# Patient Record
Sex: Male | Born: 1980 | Race: Black or African American | Hispanic: No | Marital: Single | Smoking: Current every day smoker
Health system: Southern US, Community
[De-identification: ages and names within clinical notes are randomized; demographics above are authoritative.]

## PROBLEM LIST (undated history)

## (undated) HISTORY — PX: FEMUR FRACTURE SURGERY: SHX633

---

## 2011-08-25 ENCOUNTER — Emergency Department (HOSPITAL_BASED_OUTPATIENT_CLINIC_OR_DEPARTMENT_OTHER)
Admission: EM | Admit: 2011-08-25 | Discharge: 2011-08-25 | Disposition: A | Discharge status: Home / Self Care | Payer: Self-pay | Attending: Emergency Medicine | Admitting: Emergency Medicine

## 2011-08-25 ENCOUNTER — Encounter (HOSPITAL_BASED_OUTPATIENT_CLINIC_OR_DEPARTMENT_OTHER): Payer: Self-pay | Admitting: Emergency Medicine

## 2011-08-25 ENCOUNTER — Emergency Department (HOSPITAL_BASED_OUTPATIENT_CLINIC_OR_DEPARTMENT_OTHER): Payer: Self-pay

## 2011-08-25 DIAGNOSIS — S93602A Unspecified sprain of left foot, initial encounter: Secondary | ICD-10-CM

## 2011-08-25 DIAGNOSIS — X58XXXA Exposure to other specified factors, initial encounter: Secondary | ICD-10-CM | POA: Insufficient documentation

## 2011-08-25 DIAGNOSIS — F17200 Nicotine dependence, unspecified, uncomplicated: Secondary | ICD-10-CM | POA: Insufficient documentation

## 2011-08-25 DIAGNOSIS — S93609A Unspecified sprain of unspecified foot, initial encounter: Secondary | ICD-10-CM | POA: Insufficient documentation

## 2011-08-25 MED ORDER — ACETAMINOPHEN-CODEINE #3 300-30 MG PO TABS
2.0000 | ORAL_TABLET | Freq: Once | ORAL | Status: AC
  Administered 2011-08-25: 2 via ORAL
  Filled 2011-08-25: qty 2

## 2011-08-25 MED ORDER — ACETAMINOPHEN-CODEINE #3 300-30 MG PO TABS
1.0000 | ORAL_TABLET | Freq: Four times a day (QID) | ORAL | Status: AC | PRN
Start: 2011-08-25 — End: 2011-09-04

## 2011-08-25 NOTE — ED Notes (Signed)
Pt c/o left foot pain after stepping in a hole.

## 2011-08-25 NOTE — ED Provider Notes (Signed)
History     CSN: [id number]  Arrival date & time 08/25/11  1927   First MD Initiated Contact with Patient 08/25/11 2056      Chief Complaint  Patient presents with  . Foot Injury    (Consider location/radiation/quality/duration/timing/severity/associated sxs/prior treatment) HPI Comments: 31 year old male presents approximately 24 hours after stepping in a polyp on having a hyperdorsiflexion of the left foot. The pain has been constant, moderate, worse with ambulation and associated with swelling.  Patient is a 31 y.o. male presenting with foot injury. The history is provided by the patient and the spouse.  Foot Injury  Pertinent negatives include no numbness.    History reviewed. No pertinent past medical history.  Past Surgical History  Procedure Date  . Femur fracture surgery     No family history on file.  History  Substance Use Topics  . Smoking status: Current Everyday Smoker  . Smokeless tobacco: Not on file  . Alcohol Use: Yes      Review of Systems  Musculoskeletal: Positive for joint swelling.  Neurological: Negative for numbness.    Allergies  Aspirin  Home Medications   Current Outpatient Rx  Name Route Sig Dispense Refill  . ACETAMINOPHEN-CODEINE #3 300-30 MG PO TABS Oral Take 1-2 tablets by mouth every 6 (six) hours as needed for pain. 15 tablet 0    BP 150/76  Pulse 74  Temp 98.1 F (36.7 C) (Oral)  Resp 18  Ht 6\' 3"  (1.905 m)  Wt 225 lb (102.059 kg)  BMI 28.12 kg/m2  SpO2 98%  Physical Exam  Nursing note and vitals reviewed. Constitutional: He appears well-developed and well-nourished. No distress.  HENT:  Head: Normocephalic and atraumatic.  Eyes: Conjunctivae are normal. No scleral icterus.  Cardiovascular: Normal rate, regular rhythm and intact distal pulses.   Pulmonary/Chest: Effort normal and breath sounds normal.  Musculoskeletal: He exhibits tenderness ( Tender to palpation over the dorsum of the mid left foot. There is  mild swelling in this area, there is normal pulse underlying swelling. There is mild pain with range of motion of the left second through fourth toes). He exhibits no edema.  Neurological: He is alert.       Sensation normal  Skin: Skin is warm and dry. No rash noted. He is not diaphoretic.    ED Course  Procedures (including critical care time)  Labs Reviewed - No data to display Dg Foot Complete Left  08/25/2011  *RADIOLOGY REPORT*  Clinical Data: Foot injury  LEFT FOOT - COMPLETE 3+ VIEW  Comparison: None.  Findings: Three views of the left foot submitted.  No displaced fracture or subluxation. There is a vague lucency at the base of the second and third metatarsal.  A subtle nondisplaced fracture cannot be excluded.  Clinical correlation is necessary.  IMPRESSION: No displaced fracture or subluxation. There is a vague lucency at the base of the second and third metatarsal.  A subtle nondisplaced fracture cannot be excluded.  Clinical correlation is necessary.  Original Report Authenticated By: Natasha Mead, M.D.     1. Sprain of left foot       MDM  X-rays reviewed showing no obvious displaced fracture or subluxation. I have described the x-ray in detail to the patient and encouraged him to followup in 7-10 days for repeat imaging if still having symptoms. Postop boot, Tylenol 3 given        Vida Roller, MD 08/25/11 2141

## 2011-08-25 NOTE — ED Notes (Signed)
Pt was not found on stretcher, pt was outside in car smoking. Pt assisted back into dept in w/c and placed on stretcher. NAD noted.

## 2011-08-25 NOTE — ED Notes (Signed)
Patient transported to X-ray 

## 2011-08-26 NOTE — ED Notes (Signed)
Pt called requesting work note. Dr. Anitra Lauth reviewed chart and approved 1 wk off. Work note left at [employer], pt called and will present ID when picking it up. Pt will need to f/u with ortho if not improving after 1 wk.

## 2011-09-02 ENCOUNTER — Emergency Department (HOSPITAL_BASED_OUTPATIENT_CLINIC_OR_DEPARTMENT_OTHER)
Admission: EM | Admit: 2011-09-02 | Discharge: 2011-09-02 | Disposition: A | Discharge status: Home / Self Care | Payer: Self-pay | Attending: Emergency Medicine | Admitting: Emergency Medicine

## 2011-09-02 ENCOUNTER — Encounter (HOSPITAL_BASED_OUTPATIENT_CLINIC_OR_DEPARTMENT_OTHER): Payer: Self-pay | Admitting: *Deleted

## 2011-09-02 DIAGNOSIS — X58XXXA Exposure to other specified factors, initial encounter: Secondary | ICD-10-CM | POA: Insufficient documentation

## 2011-09-02 DIAGNOSIS — F17200 Nicotine dependence, unspecified, uncomplicated: Secondary | ICD-10-CM | POA: Insufficient documentation

## 2011-09-02 DIAGNOSIS — S99929A Unspecified injury of unspecified foot, initial encounter: Secondary | ICD-10-CM

## 2011-09-02 DIAGNOSIS — Z888 Allergy status to other drugs, medicaments and biological substances status: Secondary | ICD-10-CM | POA: Insufficient documentation

## 2011-09-02 DIAGNOSIS — S8990XA Unspecified injury of unspecified lower leg, initial encounter: Secondary | ICD-10-CM | POA: Insufficient documentation

## 2011-09-02 NOTE — ED Notes (Addendum)
Left foot pain. Was seen here for injury a week ago and diagnosed with a sprain. Tried to follow up with referral we gave him and can not get in to see them x 1 week. States he is here to because he needs a work note.

## 2011-09-02 NOTE — ED Provider Notes (Signed)
History     CSN: [id number]  Arrival date & time 09/02/11  1939   First MD Initiated Contact with Patient 09/02/11 1951      Chief Complaint  Patient presents with  . Foot Injury     HPI Patient was here last week evaluated for foot injury.  Has an appointment to see orthopedist in the next day or 2.  Requesting a longer work excuse till next Monday.  States the pain and swelling have been improving.  He doesn't been new pain medicine. History reviewed. No pertinent past medical history.  Past Surgical History  Procedure Date  . Femur fracture surgery     No family history on file.  History  Substance Use Topics  . Smoking status: Current Everyday Smoker  . Smokeless tobacco: Not on file  . Alcohol Use: Yes      Review of Systems  All other systems reviewed and are negative.    Allergies  Aspirin  Home Medications   Current Outpatient Rx  Name Route Sig Dispense Refill  . ACETAMINOPHEN-CODEINE #3 300-30 MG PO TABS Oral Take 1-2 tablets by mouth every 6 (six) hours as needed for pain. 15 tablet 0    BP 142/76  Pulse 90  Temp 98.1 F (36.7 C) (Oral)  Resp 20  SpO2 98%  Physical Exam  Nursing note and vitals reviewed. Constitutional: He appears well-developed and well-nourished. No distress.  HENT:  Head: Normocephalic and atraumatic.  Eyes: Conjunctivae are normal. No scleral icterus.  Cardiovascular: Normal rate, regular rhythm and intact distal pulses.   Pulmonary/Chest: Effort normal and breath sounds normal.  Musculoskeletal: He exhibits tenderness ( Tender to palpation over the dorsum of the mid left foot. There is mild swelling in this area, there is normal pulse underlying swelling. There is mild pain with range of motion of the left second through fourth toes). He exhibits no edema.  Neurological: He is alert.       Sensation normal  Skin: Skin is warm and dry. No rash noted. He is not diaphoretic.    ED Course  Procedures (including  critical care time)  Labs Reviewed - No data to display No results found.   1. Foot injury       MDM         Nelia Shi, MD 09/02/11 2001

## 2012-05-03 ENCOUNTER — Encounter (HOSPITAL_BASED_OUTPATIENT_CLINIC_OR_DEPARTMENT_OTHER): Payer: Self-pay | Admitting: Family Medicine

## 2012-05-03 ENCOUNTER — Emergency Department (HOSPITAL_BASED_OUTPATIENT_CLINIC_OR_DEPARTMENT_OTHER)
Admission: EM | Admit: 2012-05-03 | Discharge: 2012-05-03 | Disposition: A | Discharge status: Home / Self Care | Payer: Self-pay | Attending: Emergency Medicine | Admitting: Emergency Medicine

## 2012-05-03 DIAGNOSIS — F17200 Nicotine dependence, unspecified, uncomplicated: Secondary | ICD-10-CM | POA: Insufficient documentation

## 2012-05-03 DIAGNOSIS — K0889 Other specified disorders of teeth and supporting structures: Secondary | ICD-10-CM

## 2012-05-03 DIAGNOSIS — K089 Disorder of teeth and supporting structures, unspecified: Secondary | ICD-10-CM | POA: Insufficient documentation

## 2012-05-03 MED ORDER — AMOXICILLIN 500 MG PO CAPS: 500.0000 mg | ORAL_CAPSULE | Freq: Three times a day (TID) | ORAL | Status: DC

## 2012-05-03 MED ORDER — HYDROCODONE-ACETAMINOPHEN 5-325 MG PO TABS: 2.0000 | ORAL_TABLET | ORAL | Status: DC | PRN

## 2012-05-03 NOTE — ED Provider Notes (Signed)
Medical screening examination/treatment/procedure(s) were performed by non-physician practitioner and as supervising physician I was immediately available for consultation/collaboration.  Raeford Razor, MD 05/03/12 [phone]

## 2012-05-03 NOTE — ED Provider Notes (Signed)
History     CSN: [id number]  Arrival date & time 05/03/12  1140   First MD Initiated Contact with Patient 05/03/12 1212      Chief Complaint  Patient presents with  . Dental Problem    (Consider location/radiation/quality/duration/timing/severity/associated sxs/prior treatment) Patient is a 32 y.o. male presenting with dental injury. The history is provided by the patient. No language interpreter was used.  Dental Injury This is a new problem. The current episode started yesterday. The problem occurs constantly. The problem has been gradually worsening. Nothing aggravates the symptoms. He has tried nothing for the symptoms. The treatment provided moderate relief.  Pt complains   History reviewed. No pertinent past medical history.  Past Surgical History  Procedure Laterality Date  . Femur fracture surgery      No family history on file.  History  Substance Use Topics  . Smoking status: Current Every Day Smoker  . Smokeless tobacco: Not on file  . Alcohol Use: Yes      Review of Systems  HENT: Positive for dental problem.   All other systems reviewed and are negative.    Allergies  Aspirin  Home Medications  No current outpatient prescriptions on file.  BP 139/75  Pulse 85  Temp(Src) 98.3 F (36.8 C) (Oral)  Resp 16  Ht 6\' 3"  (1.905 m)  Wt 225 lb (102.059 kg)  BMI 28.12 kg/m2  SpO2 100%  Physical Exam  Nursing note and vitals reviewed. Constitutional: He appears well-developed and well-nourished.  HENT:  Head: Normocephalic and atraumatic.  Broken tooth swollen gum left lower 2nd molar    Eyes: Conjunctivae are normal. Pupils are equal, round, and reactive to light.  Cardiovascular: Normal rate.   Pulmonary/Chest: Effort normal.  Musculoskeletal: Normal range of motion.  Neurological: He is alert.  Skin: Skin is warm.  Psychiatric: He has a normal mood and affect.    ED Course  Procedures (including critical care time)  Labs Reviewed - No  data to display No results found.   1. Toothache       MDM  amoxicillian and hydrocodone.   Pt advised to follow up with Dr. Mayford Knife for dental evaluation        Elson Areas, PA-C 05/03/12 1238  Lonia Skinner [place], [place] 05/03/12 1242

## 2012-05-03 NOTE — ED Notes (Signed)
Pt c/o left lower dental pain and swelling with h/o broken tooth.

## 2012-05-03 NOTE — ED Notes (Signed)
Report received and care assumed.

## 2012-11-26 ENCOUNTER — Emergency Department (HOSPITAL_BASED_OUTPATIENT_CLINIC_OR_DEPARTMENT_OTHER)
Admission: EM | Admit: 2012-11-26 | Discharge: 2012-11-27 | Disposition: A | Discharge status: Home / Self Care | Payer: Self-pay | Attending: Emergency Medicine | Admitting: Emergency Medicine

## 2012-11-26 ENCOUNTER — Encounter (HOSPITAL_BASED_OUTPATIENT_CLINIC_OR_DEPARTMENT_OTHER): Payer: Self-pay | Admitting: Emergency Medicine

## 2012-11-26 DIAGNOSIS — K5289 Other specified noninfective gastroenteritis and colitis: Secondary | ICD-10-CM | POA: Insufficient documentation

## 2012-11-26 DIAGNOSIS — IMO0001 Reserved for inherently not codable concepts without codable children: Secondary | ICD-10-CM | POA: Insufficient documentation

## 2012-11-26 DIAGNOSIS — Z792 Long term (current) use of antibiotics: Secondary | ICD-10-CM | POA: Insufficient documentation

## 2012-11-26 DIAGNOSIS — F17200 Nicotine dependence, unspecified, uncomplicated: Secondary | ICD-10-CM | POA: Insufficient documentation

## 2012-11-26 DIAGNOSIS — K529 Noninfective gastroenteritis and colitis, unspecified: Secondary | ICD-10-CM

## 2012-11-26 MED ORDER — ONDANSETRON 4 MG PO TBDP
4.0000 mg | ORAL_TABLET | Freq: Once | ORAL | Status: AC
  Administered 2012-11-26: 4 mg via ORAL
  Filled 2012-11-26: qty 1

## 2012-11-26 MED ORDER — ONDANSETRON 4 MG PO TBDP: ORAL_TABLET | ORAL | Status: DC

## 2012-11-26 NOTE — ED Notes (Addendum)
above note and triage not done by Joss Benton but by Alfonzo Feller

## 2012-11-26 NOTE — ED Provider Notes (Signed)
CSN: [id number]     Arrival date & time 11/26/12  2237 History  This chart was scribed for Loren Racer, MD by Dorothey Baseman, ED Scribe. This patient was seen in room MH05/MH05 and the patient's care was started at 11:14 PM.    Chief Complaint  Patient presents with  . Abdominal Pain   Patient is a 32 y.o. male presenting with abdominal pain. The history is provided by the patient. No language interpreter was used.  Abdominal Pain Pain location:  Periumbilical Pain quality: sharp   Pain radiates to:  Does not radiate Pain severity:  Moderate Onset quality:  Sudden Timing:  Intermittent Chronicity:  New Context: sick contacts and suspicious food intake   Relieved by:  Bowel activity and vomiting Associated symptoms: diarrhea, nausea and vomiting   Associated symptoms: no hematochezia    HPI Comments: Dalton Gomez is a 32 y.o. male who presents to the Emergency Department complaining of a intermittent, sharp pain to the periumbilical abdominal region. Patient reports associated nausea onset last night, 4-5 episodes of emesis and diarrhea this morning, and myalgias. He states that the abdominal pain was temporarily relieved following each episode of emesis/diarrhea. He reports that his daughter has been ill with similar symptoms and that they both ate the same food before onset of both of their symptoms. 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 He denies hematochezia. Patient reports an allergy to aspirin. He denies any other pertinent medical history.   History reviewed. No pertinent past medical history. Past Surgical History  Procedure Laterality Date  . Femur fracture surgery     History reviewed. No pertinent family history. History  Substance Use Topics  . Smoking status: Current Every Day Smoker  . Smokeless tobacco: Not on file  . Alcohol Use: Yes    Review of Systems  Gastrointestinal: Positive for nausea, vomiting, abdominal  pain and diarrhea. Negative for hematochezia.  Musculoskeletal: Positive for myalgias.    Allergies  Aspirin  Home Medications   Current Outpatient Rx  Name  Route  Sig  Dispense  Refill  . amoxicillin (AMOXIL) 500 MG capsule   Oral   Take 1 capsule (500 mg total) by mouth 3 (three) times daily.   21 capsule   0   . HYDROcodone-acetaminophen (NORCO/VICODIN) 5-325 MG per tablet   Oral   Take 2 tablets by mouth every 4 (four) hours as needed for pain.   10 tablet   0    Triage Vitals: BP 131/66  Pulse 84  Temp(Src) 98.9 F (37.2 C)  Resp 16  Wt 228 lb (103.42 kg)  SpO2 99%  Physical Exam  Nursing note and vitals reviewed. Constitutional: He is oriented to person, place, and time. He appears well-developed and well-nourished. No distress.  HENT:  Head: Normocephalic and atraumatic.  Mild posterior pharyngeal erythema.   Eyes: Conjunctivae are normal.  Neck: Normal range of motion. Neck supple.  Cardiovascular: Normal rate, regular rhythm and normal heart sounds.  Exam reveals no gallop and no friction rub.   No murmur heard. Pulmonary/Chest: Effort normal. No respiratory distress. He has no wheezes. He has no rales.  Abdominal: Soft. He exhibits no distension. There is no tenderness.  High-pitched bowel sounds.  Musculoskeletal: Normal range of motion.  Neurological: He is alert and oriented to person, place, and time.  Skin: Skin is warm and dry.  Psychiatric: He has a normal mood and affect. His behavior is  normal.    ED Course  Procedures (including critical care time)  DIAGNOSTIC STUDIES: Oxygen Saturation is 99% on room air, normal by my interpretation.    COORDINATION OF CARE: 11:19 PM- Discussed that symptoms are likely viral in nature or related to food poisoning. Patient is refusing further workup including IV fluids and labs. He asked to have a work note. Advised patient to stay well-hydrated. Will discharge patient with anti-nausea medication to  manage symptoms.  Discussed treatment plan with patient at bedside and patient verbalized agreement.     Labs Review Labs Reviewed - No data to display Imaging Review No results found.  EKG Interpretation   None       MDM  Patient well-appearing with a benign abdominal exam. Do not appear to be dehydrated at this point. Refusing any further workup. Patient is asking for work note. Return precautions given.  I personally performed the services described in this documentation, which was scribed in my presence. The recorded information has been reviewed and is accurate.      Loren Racer, MD 11/27/12 [phone]

## 2012-11-26 NOTE — ED Notes (Signed)
Onset last evening of abd pain with n/v/d

## 2014-07-31 ENCOUNTER — Emergency Department (HOSPITAL_BASED_OUTPATIENT_CLINIC_OR_DEPARTMENT_OTHER)
Admission: EM | Admit: 2014-07-31 | Discharge: 2014-07-31 | Disposition: A | Discharge status: Home / Self Care | Payer: Worker's Compensation | Attending: Emergency Medicine | Admitting: Emergency Medicine

## 2014-07-31 ENCOUNTER — Encounter (HOSPITAL_BASED_OUTPATIENT_CLINIC_OR_DEPARTMENT_OTHER): Payer: Self-pay

## 2014-07-31 ENCOUNTER — Emergency Department (HOSPITAL_BASED_OUTPATIENT_CLINIC_OR_DEPARTMENT_OTHER): Payer: Self-pay

## 2014-07-31 DIAGNOSIS — M25461 Effusion, right knee: Secondary | ICD-10-CM

## 2014-07-31 DIAGNOSIS — Z720 Tobacco use: Secondary | ICD-10-CM | POA: Insufficient documentation

## 2014-07-31 DIAGNOSIS — M25561 Pain in right knee: Secondary | ICD-10-CM

## 2014-07-31 MED ORDER — TRAMADOL HCL 50 MG PO TABS: 50.0000 mg | ORAL_TABLET | Freq: Four times a day (QID) | ORAL | Status: DC | PRN

## 2014-07-31 NOTE — Discharge Instructions (Signed)
Take tramadol as prescribed as needed for pain.  Knee Effusion The medical term for having fluid in your knee is effusion. This is often due to an internal derangement of the knee. This means something is wrong inside the knee. Some of the causes of fluid in the knee may be torn cartilage, a torn ligament, or bleeding into the joint from an injury. Your knee is likely more difficult to bend and move. This is often because there is increased pain and pressure in the joint. The time it takes for recovery from a knee effusion depends on different factors, including:   Type of injury.  Your age.  Physical and medical conditions.  Rehabilitation Strategies. How long you will be away from your normal activities will depend on what kind of knee problem you have and how much damage is present. Your knee has two types of cartilage. Articular cartilage covers the bone ends and lets your knee bend and move smoothly. Two menisci, thick pads of cartilage that form a rim inside the joint, help absorb shock and stabilize your knee. Ligaments bind the bones together and support your knee joint. Muscles move the joint, help support your knee, and take stress off the joint itself. CAUSES  Often an effusion in the knee is caused by an injury to one of the menisci. This is often a tear in the cartilage. Recovery after a meniscus injury depends on how much meniscus is damaged and whether you have damaged other knee tissue. Small tears may heal on their own with conservative treatment. Conservative means rest, limited weight bearing activity and muscle strengthening exercises. Your recovery may take up to 6 weeks.  TREATMENT  Larger tears may require surgery. Meniscus injuries may be treated during arthroscopy. Arthroscopy is a procedure in which your surgeon uses a small telescope like instrument to look in your knee. Your caregiver can make a more accurate diagnosis (learning what is wrong) by performing an arthroscopic  procedure. If your injury is on the inner margin of the meniscus, your surgeon may trim the meniscus back to a smooth rim. In other cases your surgeon will try to repair a damaged meniscus with stitches (sutures). This may make rehabilitation take longer, but may provide better long term result by helping your knee keep its shock absorption capabilities. Ligaments which are completely torn usually require surgery for repair. HOME CARE INSTRUCTIONS  Use crutches as instructed.  If a brace is applied, use as directed.  Once you are home, an ice pack applied to your swollen knee may help with discomfort and help decrease swelling.  Keep your knee raised (elevated) when you are not up and around or on crutches.  Only take over-the-counter or prescription medicines for pain, discomfort, or fever as directed by your caregiver.  Your caregivers will help with instructions for rehabilitation of your knee. This often includes strengthening exercises.  You may resume a normal diet and activities as directed. SEEK MEDICAL CARE IF:   There is increased swelling in your knee.  You notice redness, swelling, or increasing pain in your knee.  An unexplained oral temperature above 102 F (38.9 C) develops. SEEK IMMEDIATE MEDICAL CARE IF:   You develop a rash.  You have difficulty breathing.  You have any allergic reactions from medications you may have been given.  There is severe pain with any motion of the knee. MAKE SURE YOU:   Understand these instructions.  Will watch your condition.  Will get help right away  if you are not doing well or get worse. Document Released: 03/27/2003 Document Revised: 03/29/2011 Document Reviewed: 05/31/2007 [facility] Patient Information 2015 [place], [place]. This information is not intended to replace advice given to you by your health care provider. Make sure you discuss any questions you have with your health care provider. RICE: Routine Care for  Injuries The routine care of many injuries includes Rest, Ice, Compression, and Elevation (RICE). HOME CARE INSTRUCTIONS  Rest is needed to allow your body to heal. Routine activities can usually be resumed when comfortable. Injured tendons and bones can take up to 6 weeks to heal. Tendons are the cord-like structures that attach muscle to bone.  Ice following an injury helps keep the swelling down and reduces pain.  Put ice in a plastic bag.  Place a towel between your skin and the bag.  Leave the ice on for 15-20 minutes, 3-4 times a day, or as directed by your health care provider. Do this while awake, for the first 24 to 48 hours. After that, continue as directed by your caregiver.  Compression helps keep swelling down. It also gives support and helps with discomfort. If an elastic bandage has been applied, it should be removed and reapplied every 3 to 4 hours. It should not be applied tightly, but firmly enough to keep swelling down. Watch fingers or toes for swelling, bluish discoloration, coldness, numbness, or excessive pain. If any of these problems occur, remove the bandage and reapply loosely. Contact your caregiver if these problems continue.  Elevation helps reduce swelling and decreases pain. With extremities, such as the arms, hands, legs, and feet, the injured area should be placed near or above the level of the heart, if possible. SEEK IMMEDIATE MEDICAL CARE IF:  You have persistent pain and swelling.  You develop redness, numbness, or unexpected weakness.  Your symptoms are getting worse rather than improving after several days. These symptoms may indicate that further evaluation or further X-rays are needed. Sometimes, X-rays may not show a small broken bone (fracture) until 1 week or 10 days later. Make a follow-up appointment with your caregiver. Ask when your X-ray results will be ready. Make sure you get your X-ray results. Document Released: 04/18/2000 Document  Revised: 01/09/2013 Document Reviewed: 06/05/2010 [facility] Patient Information 2015 [place], [place]. This information is not intended to replace advice given to you by your health care provider. Make sure you discuss any questions you have with your health care provider.

## 2014-07-31 NOTE — ED Provider Notes (Signed)
CSN: [id number]     Arrival date & time 07/31/14  1219 History   First MD Initiated Contact with Patient 07/31/14 1231     Chief Complaint  Patient presents with  . Knee Pain     (Consider location/radiation/quality/duration/timing/severity/associated sxs/prior Treatment) HPI Comments: 34 year old male complaining of right knee pain and swelling 4 days. He cannot recall any specific injury or trauma, however states he works at [employer] and is constantly bending, squatting and lifting with his job. After work 4 days ago, his right knee became sore, gradually worsening, and 3 days ago when he woke up, he noticed his knee was swollen. Yesterday, he started to have intense pain with ambulating, improved by rest. He has been icing and elevating with some relief of the pain and swelling. Pain at rest 2/10. No prior injury to his knee, however when he was younger, broke his right femur. Denies numbness or tingling. No back pain.  Patient is a 34 y.o. male presenting with knee pain. The history is provided by the patient.  Knee Pain   History reviewed. No pertinent past medical history. Past Surgical History  Procedure Laterality Date  . Femur fracture surgery     No family history on file. History  Substance Use Topics  . Smoking status: Current Every Day Smoker  . Smokeless tobacco: Not on file  . Alcohol Use: Yes     Comment: occ    Review of Systems  Respiratory: Negative for shortness of breath.   Cardiovascular: Negative for chest pain.  Musculoskeletal:       + R knee pain and swelling.  Skin: Negative for color change.  Neurological: Negative for numbness.      Allergies  Aspirin  Home Medications   Prior to Admission medications   Medication Sig Start Date End Date Taking? Authorizing Provider  traMADol (ULTRAM) 50 MG tablet Take 1 tablet (50 mg total) by mouth every 6 (six) hours as needed. 07/31/14   Maday Guarino M Jashan Cotten, PA-C   BP 138/77 mmHg  Pulse 103  Temp(Src)  98.3 F (36.8 C) (Oral)  Resp 18  Ht 6\' 3"  (1.905 m)  Wt 232 lb (105.235 kg)  BMI 29.00 kg/m2 Physical Exam  Constitutional: He is oriented to person, place, and time. He appears well-developed and well-nourished. No distress.  HENT:  Head: Normocephalic and atraumatic.  Eyes: Conjunctivae and EOM are normal.  Neck: Normal range of motion. Neck supple.  Cardiovascular: Normal rate, regular rhythm and normal heart sounds.   Pulses:      Dorsalis pedis pulses are 2+ on the right side.       Posterior tibial pulses are 2+ on the right side.  Pulmonary/Chest: Effort normal and breath sounds normal.  Musculoskeletal: Normal range of motion.  R knee TTP medially with small effusion. No erythema or warmth. No ligamentous laxity appreciated. FROM.  Neurological: He is alert and oriented to person, place, and time.  Skin: Skin is warm and dry.  Psychiatric: He has a normal mood and affect. His behavior is normal.  Nursing note and vitals reviewed.   ED Course  Procedures (including critical care time) Labs Review Labs Reviewed - No data to display  Imaging Review Dg Knee Complete 4 Views Right  07/31/2014   CLINICAL DATA:  Twisted right knee 07/27/2014, swelling  EXAM: RIGHT KNEE - COMPLETE 4+ VIEW  COMPARISON:  None.  FINDINGS: Four views of the right knee submitted. No acute fracture or subluxation. Mild narrowing of  medial joint compartment. Small joint effusion.  IMPRESSION: No acute fracture or subluxation. Mild narrowing of medial joint compartment. Small joint effusion.   Electronically Signed   By: Natasha Mead M.D.   On: 07/31/2014 12:58     EKG Interpretation None      MDM   Final diagnoses:  Knee effusion, right  Right knee pain   NAD. Neurovascularly intact. Xray results as stated above. No ligamentous laxity appreciated. Possible click of medial meniscus. Cannot rule out meniscal injury. Knee immobilizer applied. Has crutches at home. RICE. Reports allergy to aspirin,  unsure if he has reaction to NSAIDs. Tramadol for  Pain. F/u with Dr. Pearletha Forge, sports med. Stable for d/c. Return precautions given. Patient states understanding of treatment care plan and is agreeable.  Kathrynn Speed, PA-C 07/31/14 1312  Tilden Fossa, MD 07/31/14 1345

## 2014-07-31 NOTE — ED Notes (Signed)
C/o pain/swelling to right knee-started after work day-worse after returning to work this week-denies specefic injury-pt with steady gait-has own crutch x 1

## 2015-02-12 ENCOUNTER — Encounter (HOSPITAL_BASED_OUTPATIENT_CLINIC_OR_DEPARTMENT_OTHER): Payer: Self-pay

## 2015-02-12 ENCOUNTER — Emergency Department (HOSPITAL_BASED_OUTPATIENT_CLINIC_OR_DEPARTMENT_OTHER)
Admission: EM | Admit: 2015-02-12 | Discharge: 2015-02-12 | Disposition: A | Discharge status: Home / Self Care | Payer: Self-pay | Attending: Emergency Medicine | Admitting: Emergency Medicine

## 2015-02-12 DIAGNOSIS — K047 Periapical abscess without sinus: Secondary | ICD-10-CM | POA: Insufficient documentation

## 2015-02-12 DIAGNOSIS — F17200 Nicotine dependence, unspecified, uncomplicated: Secondary | ICD-10-CM | POA: Insufficient documentation

## 2015-02-12 MED ORDER — OXYCODONE-ACETAMINOPHEN 5-325 MG PO TABS: ORAL_TABLET | ORAL | Status: DC

## 2015-02-12 MED ORDER — OXYCODONE-ACETAMINOPHEN 5-325 MG PO TABS
1.0000 | ORAL_TABLET | Freq: Once | ORAL | Status: AC
  Administered 2015-02-12: 1 via ORAL
  Filled 2015-02-12: qty 1

## 2015-02-12 MED ORDER — AMOXICILLIN 500 MG PO CAPS
1000.0000 mg | ORAL_CAPSULE | Freq: Once | ORAL | Status: AC
  Administered 2015-02-12: 1000 mg via ORAL
  Filled 2015-02-12: qty 2

## 2015-02-12 MED ORDER — AMOXICILLIN 500 MG PO CAPS: 1000.0000 mg | ORAL_CAPSULE | Freq: Two times a day (BID) | ORAL | Status: DC

## 2015-02-12 NOTE — ED Provider Notes (Signed)
CSN: [id number]     Arrival date & time 02/12/15  1702 History   First MD Initiated Contact with Patient 02/12/15 1741     Chief Complaint  Patient presents with  . Dental Injury     (Consider location/radiation/quality/duration/timing/severity/associated sxs/prior Treatment) HPI   Blood pressure 148/95, pulse 77, temperature 99.1 F (37.3 C), temperature source Oral, resp. rate 18, height  (1.905 m), weight 106.595 kg, SpO2 97 %.  Dalton Gomez is a 35 y.o. male complaining of right frontal toothache onset 3 days ago with associated swelling onset this morning. Patient has been taking Tylenol, ibuprofen and using Anbesol at home with little relief. Denies fever/chills, difficulty opening jaw, difficulty swallowing, SOB, neck swelling.    History reviewed. No pertinent past medical history. Past Surgical History  Procedure Laterality Date  . Femur fracture surgery     No family history on file. Social History  Substance Use Topics  . Smoking status: Current Every Day Smoker  . Smokeless tobacco: None  . Alcohol Use: Yes     Comment: occ    Review of Systems  10 systems reviewed and found to be negative, except as noted in the HPI.  Allergies  Aspirin  Home Medications   Prior to Admission medications   Medication Sig Start Date End Date Taking? Authorizing Provider  amoxicillin (AMOXIL) 500 MG capsule Take 2 capsules (1,000 mg total) by mouth 2 (two) times daily. 02/12/15   Laila Myhre, PA-C  oxyCODONE-acetaminophen (PERCOCET/ROXICET) 5-325 MG tablet 1 to 2 tabs PO q6hrs  PRN for pain 02/12/15   Simara Rhyner, PA-C   BP 148/95 mmHg  Pulse 77  Temp(Src) 99.1 F (37.3 C) (Oral)  Resp 18  Ht  (1.905 m)  Wt 106.595 kg  BMI 29.37 kg/m2  SpO2 97% Physical Exam  Constitutional: He is oriented to person, place, and time. He appears well-developed and well-nourished. No distress.  HENT:  Head: Normocephalic.  Mouth/Throat: Oropharynx is clear and  moist.  Generally poor dentition, mild frontal gingival swelling but no fluctuance. ++tenderness to palpation. Patient is handling their secretions. There is no tenderness to palpation or firmness underneath tongue bilaterally. No trismus.    Eyes: Conjunctivae and EOM are normal. Pupils are equal, round, and reactive to light.  Neck: Normal range of motion.  Cardiovascular: Normal rate.   Pulmonary/Chest: Effort normal. No stridor.  Abdominal: Soft.  Musculoskeletal: Normal range of motion.  Lymphadenopathy:    He has no cervical adenopathy.  Neurological: He is alert and oriented to person, place, and time.  Psychiatric: He has a normal mood and affect.  Nursing note and vitals reviewed.   ED Course  Procedures (including critical care time) Labs Review Labs Reviewed - No data to display  Imaging Review No results found. I have personally reviewed and evaluated these images and lab results as part of my medical decision-making.   EKG Interpretation None      MDM   Final diagnoses:  Dental abscess    Filed Vitals:   02/12/15 1713  BP: 148/95  Pulse: 77  Temp: 99.1 F (37.3 C)  TempSrc: Oral  Resp: 18  Height:  (1.905 m)  Weight: 106.595 kg  SpO2: 97%    Medications  amoxicillin (AMOXIL) capsule 1,000 mg (not administered)  oxyCODONE-acetaminophen (PERCOCET/ROXICET) 5-325 MG per tablet 1 tablet (not administered)    Dalton Gomez is 35 y.o. male presenting with dental pain likely early dental abscess. Patient with multiple very deep eroding  dental caries. Will be started on amoxicillin and Percocet, patient given dental resource guide.  Evaluation does not show pathology that would require ongoing emergent intervention or inpatient treatment. Pt is hemodynamically stable and mentating appropriately. Discussed findings and plan with patient/guardian, who agrees with care plan. All questions answered. Return precautions discussed and outpatient follow up  given.   New Prescriptions   AMOXICILLIN (AMOXIL) 500 MG CAPSULE    Take 2 capsules (1,000 mg total) by mouth 2 (two) times daily.   OXYCODONE-ACETAMINOPHEN (PERCOCET/ROXICET) 5-325 MG TABLET    1 to 2 tabs PO q6hrs  PRN for pain         Wynetta Emery, PA-C 02/12/15 1802  Melene Plan, DO 02/12/15 1803

## 2015-02-12 NOTE — Discharge Instructions (Signed)
Take percocet for breakthrough pain, do not drink alcohol, drive, care for children or do other critical tasks while taking percocet.  Return to the emergency room for fever, change in vision, redness to the face that rapidly spreads towards the eye, nausea or vomiting, difficulty swallowing or shortness of breath.   Apply warm compresses to jaw throughout the day.   Take your antibiotics as directed and to the end of the course. DO NOT drink alcohol when taking metronidazole, it will make you very sick!   Followup with a dentist is very important for ongoing evaluation and management of recurrent dental pain. Return to emergency department for emergent changing or worsening symptoms.  Low-cost dental clinic: [facility]  at [phone]**   You may also call [phone]  Dental Assistance If the dentist on-call cannot see you, please use the resources below:   Patients with Medicaid: [facility] [phone] [address], [phone] [address] [phone]  If unable to pay, or uninsured, contact [facility] [phone]) or [facility] Department [phone] in [place], [phone] in [facility]) to become qualified for the adult dental clinic  Other Low-Cost Community Dental Services: [facility]- [address] Natasha Bence [place], [place], [address]    [phone], Ext. [phone]    2nd and 4th Thursday of the month at 6:30am    10 clients each day by appointment, can sometimes see walk-in patients if someone does not show for an appointment [facility]- [address] Ether Griffins [place], [place], [address]    [phone] [facility] [address] [place], [place], [address]    [phone]  [facility] Health Department- [phone] [facility] Health Department- [phone] [facility] [phone]  Dental Abscess A dental abscess is pus in or around a tooth. HOME CARE  Take medicines only as told by your  dentist.  If you were prescribed antibiotic medicine, finish all of it even if you start to feel better.  Rinse your mouth (gargle) often with salt water.  Do not drive or use heavy machinery, like a lawn mower, while taking pain medicine.  Do not apply heat to the outside of your mouth.  Keep all follow-up visits as told by your dentist. This is important. GET HELP IF:  Your pain is worse, and medicine does not help. GET HELP RIGHT AWAY IF:  You have a fever or chills.  Your symptoms suddenly get worse.  You have a very bad headache.  You have problems breathing or swallowing.  You have trouble opening your mouth.  You have puffiness (swelling) in your neck or around your eye.   This information is not intended to replace advice given to you by your health care provider. Make sure you discuss any questions you have with your health care provider.   Document Released: 05/21/2014 Document Reviewed: 05/21/2014 Elsevier Interactive Patient Education [employer]

## 2015-02-12 NOTE — ED Notes (Signed)
Right toothache x 3 days-swelling to area today

## 2016-07-20 ENCOUNTER — Emergency Department (HOSPITAL_BASED_OUTPATIENT_CLINIC_OR_DEPARTMENT_OTHER)
Admission: EM | Admit: 2016-07-20 | Discharge: 2016-07-20 | Disposition: A | Discharge status: Home / Self Care | Payer: Self-pay | Attending: Emergency Medicine | Admitting: Emergency Medicine

## 2016-07-20 ENCOUNTER — Encounter (HOSPITAL_BASED_OUTPATIENT_CLINIC_OR_DEPARTMENT_OTHER): Payer: Self-pay | Admitting: Emergency Medicine

## 2016-07-20 DIAGNOSIS — Z79899 Other long term (current) drug therapy: Secondary | ICD-10-CM | POA: Insufficient documentation

## 2016-07-20 DIAGNOSIS — K047 Periapical abscess without sinus: Secondary | ICD-10-CM | POA: Insufficient documentation

## 2016-07-20 DIAGNOSIS — F17200 Nicotine dependence, unspecified, uncomplicated: Secondary | ICD-10-CM | POA: Insufficient documentation

## 2016-07-20 MED ORDER — KETOROLAC TROMETHAMINE 60 MG/2ML IM SOLN
60.0000 mg | Freq: Once | INTRAMUSCULAR | Status: AC
  Administered 2016-07-20: 60 mg via INTRAMUSCULAR
  Filled 2016-07-20: qty 2

## 2016-07-20 MED ORDER — ACETAMINOPHEN 500 MG PO TABS: 1000.0000 mg | ORAL_TABLET | Freq: Four times a day (QID) | ORAL | 0 refills | Status: AC | PRN

## 2016-07-20 MED ORDER — AMOXICILLIN-POT CLAVULANATE 875-125 MG PO TABS: 1.0000 | ORAL_TABLET | Freq: Two times a day (BID) | ORAL | 0 refills | Status: AC

## 2016-07-20 MED ORDER — NAPROXEN 250 MG PO TABS: 250.0000 mg | ORAL_TABLET | Freq: Four times a day (QID) | ORAL | 0 refills | Status: AC | PRN

## 2016-07-20 NOTE — ED Notes (Signed)
ED Provider at bedside. 

## 2016-07-20 NOTE — ED Notes (Signed)
Pt reports "he needs a root canal but the dentist will not perform this until the infection clears up". Pt also reports having hx of dental abscesses.

## 2016-07-20 NOTE — ED Provider Notes (Signed)
[facility] Provider Note   CSN: [id number] Arrival date & time: 07/20/16  1803  By signing my name below, I, Rosario Adie, attest that this documentation has been prepared under the direction and in the presence of Alvira Monday, MD. Electronically Signed: Rosario Adie, ED Scribe. 07/20/16. 11:02 PM.  History   Chief Complaint Chief Complaint  Patient presents with  . Dental Pain   The history is provided by the patient. No language interpreter was used.    HPI Comments: Dalton Gomez is a 36 y.o. male who presents to the Emergency Department complaining of persistent, gradually worsening, right-sided, upper dental pain beginning one week ago. Pt describes their pain as throbbing. He notes associated right sided facial swelling which began yesterday and has been alleviated somewhat by applying ice over the area today. He also reports some associated nausea and diarrhea secondary to this as well. Pt has also been taking Tylenol at home with minimal relief of his pain. Pt's pain is exacerbated with chewing and palpation over the right side of his face. He also reports that he does have a h/o prior dental abscess to the area which was resolved following treatment. He reports that a dental specialist informed him that he would need a root canal to the tooth; however, he was unable to have this secondary to financial constraints. Pt denies fever, chills, vomiting, or any other associated symptoms.   History reviewed. No pertinent past medical history.  There are no active problems to display for this patient.  Past Surgical History:  Procedure Laterality Date  . FEMUR FRACTURE SURGERY      Home Medications    Prior to Admission medications   Medication Sig Start Date End Date Taking? Authorizing Provider  acetaminophen (TYLENOL) 500 MG tablet Take 2 tablets (1,000 mg total) by mouth every 6 (six) hours as needed. 07/20/16   Alvira Monday, MD    amoxicillin-clavulanate (AUGMENTIN) 875-125 MG tablet Take 1 tablet by mouth every 12 (twelve) hours. 07/20/16 07/30/16  Alvira Monday, MD  naproxen (NAPROSYN) 250 MG tablet Take 1 tablet (250 mg total) by mouth every 6 (six) hours as needed. You may take 500mg  for your first dose, followed by 250mg  (1 tablet for subsequent doses) 07/20/16   Alvira Monday, MD   Family History History reviewed. No pertinent family history.  Social History Social History  Substance Use Topics  . Smoking status: Current Every Day Smoker  . Smokeless tobacco: Never Used  . Alcohol use Yes     Comment: occ   Allergies   Aspirin  Review of Systems Review of Systems  Constitutional: Negative for chills and fever.  HENT: Positive for dental problem and facial swelling.   Eyes: Negative for visual disturbance.  Respiratory: Negative for cough.   Cardiovascular: Negative for chest pain.  Gastrointestinal: Positive for nausea. Negative for diarrhea and vomiting.  Skin: Negative for rash.  Neurological: Negative for headaches.   Physical Exam Updated Vital Signs BP 132/86   Pulse 72   Temp 98.5 F (36.9 C) (Oral)   Resp 18   Ht 6\' 3"  (1.905 m)   Wt 106.6 kg (235 lb)   SpO2 98%   BMI 29.37 kg/m   Physical Exam  Constitutional: He appears well-developed and well-nourished. No distress.  HENT:  Head: Normocephalic and atraumatic.  Right Ear: Tympanic membrane, external ear and ear canal normal.  Left Ear: Tympanic membrane, external ear and ear canal normal.  Tooth #2 with severe  caries/hole. Tooth #6 missing. Tooth #7-8 with poor dentition. No drainable abscess. No sublingual swelling. No trismus  Eyes: Conjunctivae and EOM are normal. Pupils are equal, round, and reactive to light.  Neck: Normal range of motion.  Cardiovascular: Normal rate, regular rhythm and normal heart sounds.   No murmur heard. Pulmonary/Chest: Effort normal and breath sounds normal. No respiratory distress. He has no  wheezes. He has no rales.  Abdominal: He exhibits no distension.  Musculoskeletal: Normal range of motion.  Neurological: He is alert.  Skin: No pallor.  Psychiatric: He has a normal mood and affect. His behavior is normal.  Nursing note and vitals reviewed.  ED Treatments / Results  DIAGNOSTIC STUDIES: Oxygen Saturation is 100% on RA, normal by my interpretation.   COORDINATION OF CARE: 11:02 PM-Discussed next steps with pt. Pt verbalized understanding and is agreeable with the plan.   Labs (all labs ordered are listed, but only abnormal results are displayed) Labs Reviewed - No data to display  EKG  EKG Interpretation None      Radiology No results found.  Procedures Procedures   Medications Ordered in ED Medications  ketorolac (TORADOL) injection 60 mg (60 mg Intramuscular Given 07/20/16 2320)    Initial Impression / Assessment and Plan / ED Course  I have reviewed the triage vital signs and the nursing notes.  Pertinent labs & imaging results that were available during my care of the patient were reviewed by me and considered in my medical decision making (see chart for details).     36yo male presents with right dental pain. Given toradol in ED. Suspect periapical abscess. No trismus/sublingual swelling/fevers. Given naproxen, tylenol, rx for augmentin. Patient discharged in stable condition with understanding of reasons to return.     Final Clinical Impressions(s) / ED Diagnoses   Final diagnoses:  Dental abscess   New Prescriptions Discharge Medication List as of 07/20/2016 11:12 PM    START taking these medications   Details  acetaminophen (TYLENOL) 500 MG tablet Take 2 tablets (1,000 mg total) by mouth every 6 (six) hours as needed., Starting Tue 07/20/2016, Print    amoxicillin-clavulanate (AUGMENTIN) 875-125 MG tablet Take 1 tablet by mouth every 12 (twelve) hours., Starting Tue 07/20/2016, Until Fri 07/30/2016, Print    naproxen (NAPROSYN) 250 MG  tablet Take 1 tablet (250 mg total) by mouth every 6 (six) hours as needed. You may take 500mg  for your first dose, followed by 250mg  (1 tablet for subsequent doses), Starting Tue 07/20/2016, Print       I personally performed the services described in this documentation, which was scribed in my presence. The recorded information has been reviewed and is accurate.    Alvira MondaySchlossman, Chundra Sauerwein, MD 07/21/16 1131

## 2016-07-20 NOTE — ED Triage Notes (Signed)
Patient has pain to the right side of his mouth and face. Reports that swelling started today

## 2016-07-20 NOTE — ED Notes (Signed)
Called to treatment room with no answer from lobby 

## 2016-07-21 ENCOUNTER — Telehealth (HOSPITAL_BASED_OUTPATIENT_CLINIC_OR_DEPARTMENT_OTHER): Payer: Self-pay | Admitting: Emergency Medicine

## 2016-07-21 NOTE — Telephone Encounter (Signed)
Patient reports that he is unable to afford the Augmentin that he was given an RX for yesterday. Md aware and a RX for PCN VK to be called in to patients preferred pharmacy. Patient called back at [phone], phone number provided by the patient  and given education about the RX and follow up care

## 2017-05-18 ENCOUNTER — Encounter (HOSPITAL_BASED_OUTPATIENT_CLINIC_OR_DEPARTMENT_OTHER): Payer: Self-pay

## 2017-05-18 ENCOUNTER — Emergency Department (HOSPITAL_BASED_OUTPATIENT_CLINIC_OR_DEPARTMENT_OTHER): Payer: Self-pay

## 2017-05-18 ENCOUNTER — Other Ambulatory Visit: Payer: Self-pay

## 2017-05-18 ENCOUNTER — Emergency Department (HOSPITAL_BASED_OUTPATIENT_CLINIC_OR_DEPARTMENT_OTHER)
Admission: EM | Admit: 2017-05-18 | Discharge: 2017-05-18 | Disposition: A | Discharge status: Home / Self Care | Payer: Self-pay | Attending: Emergency Medicine | Admitting: Emergency Medicine

## 2017-05-18 DIAGNOSIS — Y998 Other external cause status: Secondary | ICD-10-CM | POA: Insufficient documentation

## 2017-05-18 DIAGNOSIS — S62354A Nondisplaced fracture of shaft of fourth metacarpal bone, right hand, initial encounter for closed fracture: Secondary | ICD-10-CM | POA: Insufficient documentation

## 2017-05-18 DIAGNOSIS — Y9389 Activity, other specified: Secondary | ICD-10-CM | POA: Insufficient documentation

## 2017-05-18 DIAGNOSIS — Y929 Unspecified place or not applicable: Secondary | ICD-10-CM | POA: Insufficient documentation

## 2017-05-18 DIAGNOSIS — F17290 Nicotine dependence, other tobacco product, uncomplicated: Secondary | ICD-10-CM | POA: Insufficient documentation

## 2017-05-18 DIAGNOSIS — X58XXXA Exposure to other specified factors, initial encounter: Secondary | ICD-10-CM | POA: Insufficient documentation

## 2017-05-18 MED ORDER — HYDROCODONE-ACETAMINOPHEN 5-325 MG PO TABS: 1.0000 | ORAL_TABLET | ORAL | 0 refills | Status: AC | PRN

## 2017-05-18 MED ORDER — OXYCODONE-ACETAMINOPHEN 5-325 MG PO TABS
1.0000 | ORAL_TABLET | Freq: Once | ORAL | Status: AC
  Administered 2017-05-18: 1 via ORAL
  Filled 2017-05-18: qty 1

## 2017-05-18 MED FILL — HYDROCODON-APAP 5-325: 5-325 | 2 days supply | Qty: 10 | Fill #0
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
